# Patient Record
Sex: Male | Born: 1970 | Race: White | Hispanic: No | Marital: Married | Smoking: Never smoker
Health system: Southern US, Community
[De-identification: ages and names within clinical notes are randomized; demographics above are authoritative.]

## PROBLEM LIST (undated history)

## (undated) DIAGNOSIS — E785 Hyperlipidemia, unspecified: Secondary | ICD-10-CM

## (undated) DIAGNOSIS — I10 Essential (primary) hypertension: Secondary | ICD-10-CM

## (undated) DIAGNOSIS — S82142A Displaced bicondylar fracture of left tibia, initial encounter for closed fracture: Secondary | ICD-10-CM

## (undated) DIAGNOSIS — M1990 Unspecified osteoarthritis, unspecified site: Secondary | ICD-10-CM

## (undated) DIAGNOSIS — F419 Anxiety disorder, unspecified: Secondary | ICD-10-CM

## (undated) HISTORY — PX: KNEE ARTHROSCOPY: SHX127

## (undated) HISTORY — PX: TONSILLECTOMY: SUR1361

---

## 1999-02-07 ENCOUNTER — Encounter: Admission: RE | Admit: 1999-02-07 | Discharge: 1999-02-07 | Payer: Self-pay | Admitting: Family Medicine

## 1999-02-07 ENCOUNTER — Encounter: Payer: Self-pay | Admitting: Family Medicine

## 2006-02-12 ENCOUNTER — Ambulatory Visit: Payer: Self-pay | Admitting: Internal Medicine

## 2006-10-02 ENCOUNTER — Ambulatory Visit: Payer: Self-pay | Admitting: Internal Medicine

## 2006-10-11 LAB — CONVERTED CEMR LAB
BUN: 13 mg/dL (ref 6–23)
CO2: 31 meq/L (ref 19–32)
Calcium: 9.2 mg/dL (ref 8.4–10.5)
Chloride: 101 meq/L (ref 96–112)
Creatinine, Ser: 1.2 mg/dL (ref 0.4–1.5)
GFR calc Af Amer: 89 mL/min
GFR calc non Af Amer: 73 mL/min
Glucose, Bld: 66 mg/dL — ABNORMAL LOW (ref 70–99)
Potassium: 4.1 meq/L (ref 3.5–5.1)
Sodium: 139 meq/L (ref 135–145)

## 2006-10-14 ENCOUNTER — Encounter (INDEPENDENT_AMBULATORY_CARE_PROVIDER_SITE_OTHER): Payer: Self-pay | Admitting: *Deleted

## 2007-02-28 ENCOUNTER — Telehealth (INDEPENDENT_AMBULATORY_CARE_PROVIDER_SITE_OTHER): Payer: Self-pay | Admitting: *Deleted

## 2007-03-03 ENCOUNTER — Ambulatory Visit: Payer: Self-pay | Admitting: Family Medicine

## 2007-03-06 ENCOUNTER — Encounter (INDEPENDENT_AMBULATORY_CARE_PROVIDER_SITE_OTHER): Payer: Self-pay | Admitting: *Deleted

## 2007-03-31 ENCOUNTER — Ambulatory Visit: Payer: Self-pay | Admitting: Family Medicine

## 2007-04-01 ENCOUNTER — Telehealth (INDEPENDENT_AMBULATORY_CARE_PROVIDER_SITE_OTHER): Payer: Self-pay | Admitting: Family Medicine

## 2007-04-01 DIAGNOSIS — F411 Generalized anxiety disorder: Secondary | ICD-10-CM

## 2007-04-14 ENCOUNTER — Telehealth (INDEPENDENT_AMBULATORY_CARE_PROVIDER_SITE_OTHER): Payer: Self-pay | Admitting: *Deleted

## 2007-05-27 ENCOUNTER — Telehealth (INDEPENDENT_AMBULATORY_CARE_PROVIDER_SITE_OTHER): Payer: Self-pay | Admitting: *Deleted

## 2007-09-18 ENCOUNTER — Telehealth (INDEPENDENT_AMBULATORY_CARE_PROVIDER_SITE_OTHER): Payer: Self-pay | Admitting: *Deleted

## 2008-01-28 ENCOUNTER — Telehealth (INDEPENDENT_AMBULATORY_CARE_PROVIDER_SITE_OTHER): Payer: Self-pay | Admitting: *Deleted

## 2008-03-11 ENCOUNTER — Ambulatory Visit: Payer: Self-pay | Admitting: Internal Medicine

## 2008-03-11 DIAGNOSIS — M25519 Pain in unspecified shoulder: Secondary | ICD-10-CM | POA: Insufficient documentation

## 2008-03-11 DIAGNOSIS — E785 Hyperlipidemia, unspecified: Secondary | ICD-10-CM | POA: Insufficient documentation

## 2008-03-11 DIAGNOSIS — I10 Essential (primary) hypertension: Secondary | ICD-10-CM

## 2008-03-11 LAB — CONVERTED CEMR LAB
Cholesterol, target level: 200 mg/dL
HDL goal, serum: 40 mg/dL
LDL Goal: 160 mg/dL

## 2008-03-12 ENCOUNTER — Ambulatory Visit: Payer: Self-pay | Admitting: Internal Medicine

## 2008-03-13 LAB — CONVERTED CEMR LAB
BUN: 16 mg/dL (ref 6–23)
CO2: 30 meq/L (ref 19–32)
Calcium: 8.9 mg/dL (ref 8.4–10.5)
Chloride: 102 meq/L (ref 96–112)
Creatinine, Ser: 1.2 mg/dL (ref 0.4–1.5)
GFR calc Af Amer: 88 mL/min
GFR calc non Af Amer: 73 mL/min
Glucose, Bld: 91 mg/dL (ref 70–99)
Potassium: 3.8 meq/L (ref 3.5–5.1)
Sodium: 139 meq/L (ref 135–145)

## 2008-03-15 ENCOUNTER — Encounter (INDEPENDENT_AMBULATORY_CARE_PROVIDER_SITE_OTHER): Payer: Self-pay | Admitting: *Deleted

## 2008-04-26 ENCOUNTER — Ambulatory Visit: Payer: Self-pay | Admitting: Internal Medicine

## 2008-04-26 LAB — CONVERTED CEMR LAB: LDL Goal: 130 mg/dL

## 2008-05-05 ENCOUNTER — Encounter: Payer: Self-pay | Admitting: Internal Medicine

## 2008-07-16 ENCOUNTER — Ambulatory Visit: Payer: Self-pay | Admitting: Family Medicine

## 2008-07-16 DIAGNOSIS — IMO0001 Reserved for inherently not codable concepts without codable children: Secondary | ICD-10-CM

## 2008-07-16 DIAGNOSIS — R5383 Other fatigue: Secondary | ICD-10-CM

## 2008-07-16 DIAGNOSIS — R5381 Other malaise: Secondary | ICD-10-CM | POA: Insufficient documentation

## 2008-07-16 DIAGNOSIS — R079 Chest pain, unspecified: Secondary | ICD-10-CM

## 2008-07-20 ENCOUNTER — Encounter (INDEPENDENT_AMBULATORY_CARE_PROVIDER_SITE_OTHER): Payer: Self-pay | Admitting: *Deleted

## 2008-07-28 ENCOUNTER — Encounter (INDEPENDENT_AMBULATORY_CARE_PROVIDER_SITE_OTHER): Payer: Self-pay | Admitting: *Deleted

## 2009-02-02 ENCOUNTER — Telehealth (INDEPENDENT_AMBULATORY_CARE_PROVIDER_SITE_OTHER): Payer: Self-pay | Admitting: *Deleted

## 2009-05-20 ENCOUNTER — Ambulatory Visit: Payer: Self-pay | Admitting: Internal Medicine

## 2009-08-22 ENCOUNTER — Telehealth (INDEPENDENT_AMBULATORY_CARE_PROVIDER_SITE_OTHER): Payer: Self-pay | Admitting: *Deleted

## 2010-03-26 LAB — CONVERTED CEMR LAB
ALT: 27 units/L (ref 0–53)
AST: 30 units/L (ref 0–37)
Albumin: 4.3 g/dL (ref 3.5–5.2)
Alkaline Phosphatase: 74 units/L (ref 39–117)
BUN: 21 mg/dL (ref 6–23)
Basophils Absolute: 0 10*3/uL (ref 0.0–0.1)
Basophils Absolute: 0.1 10*3/uL (ref 0.0–0.1)
Basophils Relative: 0.4 % (ref 0.0–3.0)
Basophils Relative: 1.6 % — ABNORMAL HIGH (ref 0.0–1.0)
Bilirubin, Direct: 0.1 mg/dL (ref 0.0–0.3)
CO2: 28 meq/L (ref 19–32)
Calcium: 9.1 mg/dL (ref 8.4–10.5)
Chloride: 108 meq/L (ref 96–112)
Cholesterol: 220 mg/dL (ref 0–200)
Creatinine, Ser: 1.3 mg/dL (ref 0.4–1.5)
Direct LDL: 150.4 mg/dL
EBV NA IgG: 3.12 — ABNORMAL HIGH
EBV VCA IgG: 6.09 — ABNORMAL HIGH
EBV VCA IgM: 0.11
Eosinophils Absolute: 0.1 10*3/uL (ref 0.0–0.6)
Eosinophils Absolute: 0.1 10*3/uL (ref 0.0–0.7)
Eosinophils Relative: 0.9 % (ref 0.0–5.0)
Eosinophils Relative: 1.9 % (ref 0.0–5.0)
Folate: 16.3 ng/mL
GFR calc non Af Amer: 66.06 mL/min (ref >60)
Glucose, Bld: 95 mg/dL (ref 70–99)
HCT: 39.4 % (ref 39.0–52.0)
HCT: 41.4 % (ref 39.0–52.0)
HDL: 54.3 mg/dL (ref >39.0)
Hemoglobin: 13.8 g/dL (ref 13.0–17.0)
Hemoglobin: 14.3 g/dL (ref 13.0–17.0)
Lymphocytes Relative: 17.7 % (ref 12.0–46.0)
Lymphocytes Relative: 42.1 % (ref 12.0–46.0)
Lymphs Abs: 1.7 10*3/uL (ref 0.7–4.0)
MCHC: 34.6 g/dL (ref 30.0–36.0)
MCHC: 35.1 g/dL (ref 30.0–36.0)
MCV: 89.5 fL (ref 78.0–100.0)
MCV: 93.3 fL (ref 78.0–100.0)
Mono Screen: NEGATIVE
Monocytes Absolute: 0.2 10*3/uL (ref 0.1–1.0)
Monocytes Absolute: 0.2 10*3/uL (ref 0.2–0.7)
Monocytes Relative: 3.9 % (ref 3.0–11.0)
Monocytes Relative: 4.8 % (ref 3.0–12.0)
Neutro Abs: 2 10*3/uL (ref 1.4–7.7)
Neutro Abs: 4.5 10*3/uL (ref 1.4–7.7)
Neutrophils Relative %: 50.8 % (ref 43.0–77.0)
Neutrophils Relative %: 75.9 % (ref 43.0–77.0)
Platelets: 153 10*3/uL (ref 150.0–400.0)
Platelets: 156 10*3/uL (ref 150–400)
Potassium: 4.5 meq/L (ref 3.5–5.1)
RBC: 4.4 M/uL (ref 4.22–5.81)
RBC: 4.44 M/uL (ref 4.22–5.81)
RDW: 12.2 % (ref 11.5–14.6)
RDW: 12.4 % (ref 11.5–14.6)
Sodium: 140 meq/L (ref 135–145)
TSH: 1.11 microintl units/mL (ref 0.35–5.50)
TSH: 1.33 microintl units/mL (ref 0.35–5.50)
Total Bilirubin: 0.5 mg/dL (ref 0.3–1.2)
Total CHOL/HDL Ratio: 4.1
Total CK: 152 units/L (ref 7–232)
Total Protein: 6.9 g/dL (ref 6.0–8.3)
Triglycerides: 97 mg/dL (ref 0–149)
VLDL: 19 mg/dL (ref 0–40)
Vit D, 25-Hydroxy: 36 ng/mL (ref 30–89)
Vitamin B-12: 445 pg/mL (ref 211–911)
WBC: 4 10*3/uL — ABNORMAL LOW (ref 4.5–10.5)
WBC: 5.9 10*3/uL (ref 4.5–10.5)

## 2010-03-28 NOTE — Progress Notes (Signed)
Summary: refill  Phone Note Refill Request Message from:  Fax from Pharmacy on August 22, 2009 10:07 AM  Refills Requested: Medication #1:  LORAZEPAM 0.5 MG TABS 1 q 8-12 hrs as needed stress. [facility] [address] - fax [phone]   Initial call taken by: Okey Regal [place],  August 22, 2009 10:10 AM    Prescriptions: LORAZEPAM 0.5 MG TABS (LORAZEPAM) 1 q 8-12 hrs as needed stress  #30 x 1   Entered by:   Shonna Chock   Authorized by:   Marga Melnick MD   Signed by:   Shonna Chock on 08/22/2009   Method used:   Printed then faxed to ...       [facility] [address] (retail)       [address]       [place], [place]  [address]       Ph: [phone]       Fax: [phone]   RxID:   [id number]

## 2010-03-28 NOTE — Assessment & Plan Note (Signed)
Summary: bp elevated/kdc   Vital Signs:  Patient profile:   40 year old male Weight:      181.6 pounds BMI:     25.07 Pulse rate:   64 / minute Resp:     14 per minute BP sitting:   116 / 64  (right arm) Cuff size:   regular  Vitals Entered By: Shonna Chock (May 20, 2009 8:03 AM) CC: B/P follow-up and refill meds , Hypertension Management, Lipid Management Comments REVIEWED MED LIST, PATIENT AGREED DOSE AND INSTRUCTION CORRECT    Primary Care Provider:  Alfonse Flavors  CC:  B/P follow-up and refill meds , Hypertension Management, and Lipid Management.  History of Present Illness: He was off meds5 days; BP high 140/88.BP averages 115/70s on meds. On meds 2 years. Major life stressors : building house, selling a home & job issues. No specific diet ; CVE as P-90X with weights & cardio w/o symptoms. Maternal FH of premature CAD/ MI. Statin  D/Ced after 1 month due to fatigue & weakness; he also has been off ASA. NMR reviewed; LDL goal = < 130. Risks discussed.  Hypertension History:      He complains of side effects from treatment, but denies chest pain, palpitations, dyspnea with exertion, orthopnea, PND, peripheral edema, visual symptoms, neurologic problems, and syncope.  Some stress related  headaches .  Further comments include: See comments re: weakness with statin.        Positive major cardiovascular risk factors include hyperlipidemia, hypertension, and family history for ischemic heart disease (females less than 57 years old & males less than 19 years old).  Negative major cardiovascular risk factors include male age less than 80 years old, no history of diabetes, and non-tobacco-user status.        Further assessment for target organ damage reveals no history of ASHD, stroke/TIA, or peripheral vascular disease.    Lipid Management History:      Positive NCEP/ATP III risk factors include family history for ischemic heart disease (females less than 44 years old & males less than 70 years  old) and hypertension.  Negative NCEP/ATP III risk factors include male age less than 16 years old, non-diabetic, non-tobacco-user status, no ASHD (atherosclerotic heart disease), no prior stroke/TIA, no peripheral vascular disease, and no history of aortic aneurysm.      Allergies (verified): No Known Drug Allergies  Past History:  Past Medical History: Anxiety; Hypertension Hyperlipidemia: NMR : LDL 166(1795/422), HDL 53,TG 111. LDL goal = < 130  Review of Systems Eyes:  Denies blurring, double vision, and vision loss-both eyes. CV:  Denies leg cramps with exertion, lightheadness, and near fainting. Neuro:  Denies numbness and tingling. Psych:  Complains of anxiety and panic attacks; denies depression, easily angered, easily tearful, and irritability. Endo:  No constellation of headache, flushing , chest pain & diarrhea.  Physical Exam  General:  well-nourished; alert,appropriate and cooperative throughout examination Eyes:  No corneal or conjunctival inflammation notedNo lid lag.Perrla. Neck:  No deformities, masses, or tenderness noted. Lungs:  Normal respiratory effort, chest expands symmetrically. Lungs are clear to auscultation, no crackles or wheezes. Heart:  Normal rate and regular rhythm. S1 and S2 normal without gallop, murmur, click, rub. Pulses:  R and L carotid,radial,dorsalis pedis and posterior tibial pulses are full and equal bilaterally Extremities:  No clubbing, cyanosis, edema. Neurologic:  alert & oriented X3 and DTRs symmetrical and normal.  No tremor  Skin:  Intact without suspicious lesions or rashes Psych:  memory intact for  recent and remote, normally interactive, good eye contact, not anxious appearing, and not depressed appearing.     Impression & Recommendations:  Problem # 1:  HYPERTENSION (ICD-401.9) controlled back on meds His updated medication list for this problem includes:    Lisinopril 10 Mg Tabs (Lisinopril) .Marland Kitchen... 1 by mouth once  daily  Problem # 2:  HYPERLIPIDEMIA (ICD-272.4) off statin  Problem # 3:  ANXIETY (ICD-300.00)  His updated medication list for this problem includes:    Citalopram Hydrobromide 20 Mg Tabs (Citalopram hydrobromide) .Marland Kitchen... Take one tablet daily    Lorazepam 0.5 Mg Tabs (Lorazepam) .Marland Kitchen... 1 q 8-12 hrs as needed stress  Complete Medication List: 1)  Lisinopril 10 Mg Tabs (Lisinopril) .Marland Kitchen.. 1 by mouth once daily 2)  Citalopram Hydrobromide 20 Mg Tabs (Citalopram hydrobromide) .... Take one tablet daily 3)  Pravastatin Sodium 20 Mg Tabs (pravastatin Sodium)  .Marland Kitchen.. 1 at bedtime 4)  Lorazepam 0.5 Mg Tabs (Lorazepam) .Marland Kitchen.. 1 q 8-12 hrs as needed stress  Hypertension Assessment/Plan:      The patient's hypertensive risk group is category B: At least one risk factor (excluding diabetes) with no target organ damage.  Today's blood pressure is 116/64.    Lipid Assessment/Plan:      Based on NCEP/ATP III, the patient's risk factor category is "2 or more risk factors and a calculated 10 year CAD risk of > 20%".  The patient's lipid goals are as follows: Total cholesterol goal is 200; LDL cholesterol goal is 130; HDL cholesterol goal is 40; Triglyceride goal is 150.  His LDL cholesterol goal has not been met.  Secondary causes for hyperlipidemia have been ruled out.  He has been counseled on adjunctive measures for lowering his cholesterol and has been provided with dietary instructions.    Patient Instructions: 1)  Please schedule a follow-up appointment in 3 months. 2)  Take an Aspirin every day. 3)  BUN,creat,K+ prior to visit, ICD-9:401.9 4)  Hepatic Panel, CPK  prior to visit, ICD-9:995.20 5)  Lipid Panel prior to visit, ICD-9:272.4. Your LDL MINIMAL goal = < 130 based on NMR Lipoprofile Prescriptions: LORAZEPAM 0.5 MG TABS (LORAZEPAM) 1 q 8-12 hrs as needed stress  #30 x 1   Entered and Authorized by:   Marga Melnick MD   Signed by:   Marga Melnick MD on 05/20/2009   Method used:   Print then  Give to Patient   RxID:   [id number] PRAVASTATIN SODIUM 20 MG TABS (PRAVASTATIN SODIUM) 1 at bedtime  #90 x 0   Entered and Authorized by:   Marga Melnick MD   Signed by:   Marga Melnick MD on 05/20/2009   Method used:   Print then Give to Patient   RxID:   [id number] CITALOPRAM HYDROBROMIDE 20 MG TABS (CITALOPRAM HYDROBROMIDE) take one tablet daily  #90 x 0   Entered and Authorized by:   Marga Melnick MD   Signed by:   Marga Melnick MD on 05/20/2009   Method used:   Print then Give to Patient   RxID:   [id number] LISINOPRIL 10 MG TABS (LISINOPRIL) 1 by mouth once daily  #90 x 3   Entered and Authorized by:   Marga Melnick MD   Signed by:   Marga Melnick MD on 05/20/2009   Method used:   Print then Give to Patient   RxID:   [id number]

## 2010-05-23 ENCOUNTER — Other Ambulatory Visit: Payer: Self-pay | Admitting: Internal Medicine

## 2010-05-23 MED ORDER — LISINOPRIL 10 MG PO TABS: 10.0000 mg | ORAL_TABLET | Freq: Every day | ORAL | Status: AC

## 2016-04-04 DIAGNOSIS — I10 Essential (primary) hypertension: Secondary | ICD-10-CM | POA: Diagnosis not present

## 2016-04-04 DIAGNOSIS — E785 Hyperlipidemia, unspecified: Secondary | ICD-10-CM | POA: Diagnosis not present

## 2016-04-04 DIAGNOSIS — N182 Chronic kidney disease, stage 2 (mild): Secondary | ICD-10-CM | POA: Diagnosis not present

## 2016-04-20 DIAGNOSIS — J22 Unspecified acute lower respiratory infection: Secondary | ICD-10-CM | POA: Diagnosis not present

## 2016-04-23 DIAGNOSIS — J069 Acute upper respiratory infection, unspecified: Secondary | ICD-10-CM | POA: Diagnosis not present

## 2016-04-23 DIAGNOSIS — J3489 Other specified disorders of nose and nasal sinuses: Secondary | ICD-10-CM | POA: Diagnosis not present

## 2016-07-16 DIAGNOSIS — Z125 Encounter for screening for malignant neoplasm of prostate: Secondary | ICD-10-CM | POA: Diagnosis not present

## 2016-07-16 DIAGNOSIS — Z1389 Encounter for screening for other disorder: Secondary | ICD-10-CM | POA: Diagnosis not present

## 2016-07-16 DIAGNOSIS — Z0000 Encounter for general adult medical examination without abnormal findings: Secondary | ICD-10-CM | POA: Diagnosis not present

## 2016-08-06 DIAGNOSIS — E785 Hyperlipidemia, unspecified: Secondary | ICD-10-CM | POA: Diagnosis not present

## 2016-08-06 DIAGNOSIS — N182 Chronic kidney disease, stage 2 (mild): Secondary | ICD-10-CM | POA: Diagnosis not present

## 2016-08-06 DIAGNOSIS — I129 Hypertensive chronic kidney disease with stage 1 through stage 4 chronic kidney disease, or unspecified chronic kidney disease: Secondary | ICD-10-CM | POA: Diagnosis not present

## 2017-02-05 DIAGNOSIS — E785 Hyperlipidemia, unspecified: Secondary | ICD-10-CM | POA: Diagnosis not present

## 2017-02-05 DIAGNOSIS — N182 Chronic kidney disease, stage 2 (mild): Secondary | ICD-10-CM | POA: Diagnosis not present

## 2017-02-05 DIAGNOSIS — I129 Hypertensive chronic kidney disease with stage 1 through stage 4 chronic kidney disease, or unspecified chronic kidney disease: Secondary | ICD-10-CM | POA: Diagnosis not present

## 2017-09-30 ENCOUNTER — Inpatient Hospital Stay (HOSPITAL_COMMUNITY)
Admission: AD | Admit: 2017-09-30 | Discharge: 2017-10-03 | DRG: 493 | Disposition: A | Discharge status: Home / Self Care | Payer: BLUE CROSS/BLUE SHIELD | Source: Other Acute Inpatient Hospital | Attending: Orthopedic Surgery | Admitting: Orthopedic Surgery

## 2017-09-30 ENCOUNTER — Encounter (HOSPITAL_COMMUNITY): Payer: Self-pay | Admitting: *Deleted

## 2017-09-30 ENCOUNTER — Other Ambulatory Visit: Payer: Self-pay

## 2017-09-30 DIAGNOSIS — Z01818 Encounter for other preprocedural examination: Secondary | ICD-10-CM

## 2017-09-30 DIAGNOSIS — Z79899 Other long term (current) drug therapy: Secondary | ICD-10-CM

## 2017-09-30 DIAGNOSIS — D62 Acute posthemorrhagic anemia: Secondary | ICD-10-CM | POA: Diagnosis not present

## 2017-09-30 DIAGNOSIS — E8889 Other specified metabolic disorders: Secondary | ICD-10-CM | POA: Diagnosis present

## 2017-09-30 DIAGNOSIS — Y92410 Unspecified street and highway as the place of occurrence of the external cause: Secondary | ICD-10-CM

## 2017-09-30 DIAGNOSIS — F411 Generalized anxiety disorder: Secondary | ICD-10-CM | POA: Diagnosis present

## 2017-09-30 DIAGNOSIS — S82142A Displaced bicondylar fracture of left tibia, initial encounter for closed fracture: Principal | ICD-10-CM | POA: Diagnosis present

## 2017-09-30 DIAGNOSIS — Z419 Encounter for procedure for purposes other than remedying health state, unspecified: Secondary | ICD-10-CM

## 2017-09-30 DIAGNOSIS — F329 Major depressive disorder, single episode, unspecified: Secondary | ICD-10-CM | POA: Diagnosis present

## 2017-09-30 DIAGNOSIS — S40811A Abrasion of right upper arm, initial encounter: Secondary | ICD-10-CM | POA: Diagnosis not present

## 2017-09-30 DIAGNOSIS — I10 Essential (primary) hypertension: Secondary | ICD-10-CM | POA: Diagnosis present

## 2017-09-30 DIAGNOSIS — E785 Hyperlipidemia, unspecified: Secondary | ICD-10-CM | POA: Diagnosis present

## 2017-09-30 DIAGNOSIS — T148XXA Other injury of unspecified body region, initial encounter: Secondary | ICD-10-CM

## 2017-09-30 DIAGNOSIS — S83282A Other tear of lateral meniscus, current injury, left knee, initial encounter: Secondary | ICD-10-CM | POA: Diagnosis present

## 2017-09-30 HISTORY — DX: Displaced bicondylar fracture of left tibia, initial encounter for closed fracture: S82.142A

## 2017-09-30 HISTORY — DX: Hyperlipidemia, unspecified: E78.5

## 2017-09-30 MED ORDER — HYDROMORPHONE HCL 1 MG/ML IJ SOLN
1.0000 mg | INTRAMUSCULAR | Status: DC | PRN
  Administered 2017-10-01 (×4): 1 mg via INTRAVENOUS
  Filled 2017-09-30 (×4): qty 1

## 2017-09-30 MED ORDER — SODIUM CHLORIDE 0.9 % IV SOLN
INTRAVENOUS | Status: DC
Start: 2017-09-30 — End: 2017-10-01
  Administered 2017-09-30: via INTRAVENOUS

## 2017-09-30 MED ORDER — OXYCODONE HCL 5 MG PO TABS
5.0000 mg | ORAL_TABLET | ORAL | Status: DC | PRN
Start: 2017-09-30 — End: 2017-10-01
  Administered 2017-09-30 – 2017-10-01 (×3): 10 mg via ORAL
  Filled 2017-09-30 (×3): qty 2

## 2017-09-30 NOTE — Progress Notes (Signed)
I spoke with Mr Gregory Yoder, patient asked that I speak to his wife Gregory Yoder, because he is in too much pain. Mrs Gregory Yoder answered all of history questions and I instructed her where to come.  Then Mrs Gregory Yoder asked me if we have a bed for patient that [facility].  I said we have a pre op room, then Mrs Gregory Yoder said "he is suppose to transfer today."  I told her that I did not have that information, but would verify with bed placement that he is to be transferred here.  Bed placement confirmed that patient is to be transferred her, but there is no bed assignment at this time.

## 2017-10-01 ENCOUNTER — Inpatient Hospital Stay (HOSPITAL_COMMUNITY): Payer: BLUE CROSS/BLUE SHIELD | Admitting: Certified Registered Nurse Anesthetist

## 2017-10-01 ENCOUNTER — Inpatient Hospital Stay (HOSPITAL_COMMUNITY): Payer: BLUE CROSS/BLUE SHIELD

## 2017-10-01 ENCOUNTER — Inpatient Hospital Stay (HOSPITAL_COMMUNITY)
Admission: RE | Admit: 2017-10-01 | Payer: BLUE CROSS/BLUE SHIELD | Source: Ambulatory Visit | Admitting: Orthopedic Surgery

## 2017-10-01 ENCOUNTER — Encounter (HOSPITAL_COMMUNITY): Payer: Self-pay

## 2017-10-01 ENCOUNTER — Encounter (HOSPITAL_COMMUNITY)
Admission: AD | Disposition: A | Discharge status: Home / Self Care | Payer: Self-pay | Source: Other Acute Inpatient Hospital | Attending: Orthopedic Surgery

## 2017-10-01 DIAGNOSIS — S82142A Displaced bicondylar fracture of left tibia, initial encounter for closed fracture: Secondary | ICD-10-CM

## 2017-10-01 HISTORY — DX: Essential (primary) hypertension: I10

## 2017-10-01 HISTORY — DX: Unspecified osteoarthritis, unspecified site: M19.90

## 2017-10-01 HISTORY — PX: ORIF TIBIA PLATEAU: SHX2132

## 2017-10-01 HISTORY — DX: Displaced bicondylar fracture of left tibia, initial encounter for closed fracture: S82.142A

## 2017-10-01 HISTORY — DX: Anxiety disorder, unspecified: F41.9

## 2017-10-01 LAB — COMPREHENSIVE METABOLIC PANEL
ALT: 20 U/L (ref 0–44)
AST: 23 U/L (ref 15–41)
Albumin: 3.6 g/dL (ref 3.5–5.0)
Alkaline Phosphatase: 66 U/L (ref 38–126)
Anion gap: 9 (ref 5–15)
BUN: 9 mg/dL (ref 6–20)
CHLORIDE: 101 mmol/L (ref 98–111)
CO2: 27 mmol/L (ref 22–32)
Calcium: 8.3 mg/dL — ABNORMAL LOW (ref 8.9–10.3)
Creatinine, Ser: 1.2 mg/dL (ref 0.61–1.24)
GFR calc non Af Amer: >60 mL/min (ref >60)
Glucose, Bld: 100 mg/dL — ABNORMAL HIGH (ref 70–99)
POTASSIUM: 3.6 mmol/L (ref 3.5–5.1)
Sodium: 137 mmol/L (ref 135–145)
TOTAL PROTEIN: 5.9 g/dL — AB (ref 6.5–8.1)
Total Bilirubin: 0.8 mg/dL (ref 0.3–1.2)

## 2017-10-01 LAB — PROTIME-INR
INR: 0.97
Prothrombin Time: 12.8 seconds (ref 11.4–15.2)

## 2017-10-01 LAB — CBC
HCT: 34.8 % — ABNORMAL LOW (ref 39.0–52.0)
Hemoglobin: 11.6 g/dL — ABNORMAL LOW (ref 13.0–17.0)
MCH: 30.4 pg (ref 26.0–34.0)
MCHC: 33.3 g/dL (ref 30.0–36.0)
MCV: 91.1 fL (ref 78.0–100.0)
PLATELETS: 138 10*3/uL — AB (ref 150–400)
RBC: 3.82 MIL/uL — ABNORMAL LOW (ref 4.22–5.81)
RDW: 13 % (ref 11.5–15.5)
WBC: 5.8 10*3/uL (ref 4.0–10.5)

## 2017-10-01 LAB — SEDIMENTATION RATE: Sed Rate: 12 mm/hr (ref 0–16)

## 2017-10-01 LAB — MRSA PCR SCREENING: MRSA by PCR: NEGATIVE

## 2017-10-01 SURGERY — OPEN REDUCTION INTERNAL FIXATION (ORIF) TIBIAL PLATEAU
Anesthesia: General | Laterality: Left

## 2017-10-01 MED ORDER — PROPOFOL 10 MG/ML IV BOLUS
INTRAVENOUS | Status: DC | PRN
  Administered 2017-10-01: 200 mg via INTRAVENOUS

## 2017-10-01 MED ORDER — LIDOCAINE HCL (CARDIAC) PF 100 MG/5ML IV SOSY
PREFILLED_SYRINGE | INTRAVENOUS | Status: DC | PRN
  Administered 2017-10-01: 100 mg via INTRAVENOUS

## 2017-10-01 MED ORDER — MIDAZOLAM HCL 2 MG/2ML IJ SOLN
INTRAMUSCULAR | Status: AC
  Filled 2017-10-01: qty 2

## 2017-10-01 MED ORDER — HYDROMORPHONE HCL 1 MG/ML IJ SOLN: 0.5000 mg | INTRAMUSCULAR | Status: DC | PRN

## 2017-10-01 MED ORDER — POTASSIUM CHLORIDE IN NACL 20-0.9 MEQ/L-% IV SOLN
INTRAVENOUS | Status: DC
  Administered 2017-10-01 – 2017-10-03 (×4): via INTRAVENOUS
  Filled 2017-10-01 (×4): qty 1000

## 2017-10-01 MED ORDER — HYDROMORPHONE HCL 1 MG/ML IJ SOLN
0.5000 mg | INTRAMUSCULAR | Status: DC | PRN
  Administered 2017-10-01 – 2017-10-03 (×9): 1 mg via INTRAVENOUS
  Filled 2017-10-01 (×9): qty 1

## 2017-10-01 MED ORDER — FENTANYL CITRATE (PF) 250 MCG/5ML IJ SOLN
INTRAMUSCULAR | Status: AC
  Filled 2017-10-01: qty 5

## 2017-10-01 MED ORDER — ACETAMINOPHEN 650 MG RE SUPP: 650.0000 mg | Freq: Four times a day (QID) | RECTAL | Status: DC | PRN

## 2017-10-01 MED ORDER — ONDANSETRON HCL 4 MG/2ML IJ SOLN: 4.0000 mg | Freq: Four times a day (QID) | INTRAMUSCULAR | Status: DC | PRN

## 2017-10-01 MED ORDER — LIDOCAINE 2% (20 MG/ML) 5 ML SYRINGE
INTRAMUSCULAR | Status: AC
  Filled 2017-10-01: qty 5

## 2017-10-01 MED ORDER — METHOCARBAMOL 1000 MG/10ML IJ SOLN
1000.0000 mg | Freq: Three times a day (TID) | INTRAVENOUS | Status: DC
  Filled 2017-10-01 (×8): qty 10

## 2017-10-01 MED ORDER — METOCLOPRAMIDE HCL 5 MG PO TABS: 5.0000 mg | ORAL_TABLET | Freq: Three times a day (TID) | ORAL | Status: DC | PRN

## 2017-10-01 MED ORDER — METHOCARBAMOL 750 MG PO TABS
750.0000 mg | ORAL_TABLET | Freq: Three times a day (TID) | ORAL | Status: DC
  Administered 2017-10-01 – 2017-10-03 (×5): 750 mg via ORAL
  Filled 2017-10-01 (×5): qty 1

## 2017-10-01 MED ORDER — ACETAMINOPHEN 325 MG PO TABS: 325.0000 mg | ORAL_TABLET | Freq: Four times a day (QID) | ORAL | Status: DC | PRN

## 2017-10-01 MED ORDER — HYDROCODONE-ACETAMINOPHEN 7.5-325 MG PO TABS: 1.0000 | ORAL_TABLET | ORAL | Status: DC | PRN

## 2017-10-01 MED ORDER — ROCURONIUM BROMIDE 10 MG/ML (PF) SYRINGE
PREFILLED_SYRINGE | INTRAVENOUS | Status: AC
  Filled 2017-10-01: qty 10

## 2017-10-01 MED ORDER — PROPOFOL 10 MG/ML IV BOLUS
INTRAVENOUS | Status: AC
  Filled 2017-10-01: qty 20

## 2017-10-01 MED ORDER — CITALOPRAM HYDROBROMIDE 10 MG PO TABS
10.0000 mg | ORAL_TABLET | Freq: Every day | ORAL | Status: DC
  Administered 2017-10-02 – 2017-10-03 (×2): 10 mg via ORAL
  Filled 2017-10-01 (×2): qty 1

## 2017-10-01 MED ORDER — SUGAMMADEX SODIUM 200 MG/2ML IV SOLN
INTRAVENOUS | Status: DC | PRN
  Administered 2017-10-01: 200 mg via INTRAVENOUS

## 2017-10-01 MED ORDER — FENTANYL CITRATE (PF) 100 MCG/2ML IJ SOLN
INTRAMUSCULAR | Status: DC | PRN
  Administered 2017-10-01: 100 ug via INTRAVENOUS
  Administered 2017-10-01 (×2): 50 ug via INTRAVENOUS
  Administered 2017-10-01: 150 ug via INTRAVENOUS
  Administered 2017-10-01 (×2): 50 ug via INTRAVENOUS

## 2017-10-01 MED ORDER — ENOXAPARIN SODIUM 40 MG/0.4ML ~~LOC~~ SOLN
40.0000 mg | SUBCUTANEOUS | Status: DC
  Administered 2017-10-02 – 2017-10-03 (×2): 40 mg via SUBCUTANEOUS
  Filled 2017-10-01 (×2): qty 0.4

## 2017-10-01 MED ORDER — POLYETHYLENE GLYCOL 3350 17 G PO PACK: 17.0000 g | PACK | Freq: Every day | ORAL | Status: DC

## 2017-10-01 MED ORDER — DOCUSATE SODIUM 100 MG PO CAPS
100.0000 mg | ORAL_CAPSULE | Freq: Two times a day (BID) | ORAL | Status: DC
  Administered 2017-10-02 – 2017-10-03 (×2): 100 mg via ORAL
  Filled 2017-10-01 (×3): qty 1

## 2017-10-01 MED ORDER — FENTANYL CITRATE (PF) 100 MCG/2ML IJ SOLN
25.0000 ug | INTRAMUSCULAR | Status: DC | PRN
  Administered 2017-10-01 (×3): 50 ug via INTRAVENOUS

## 2017-10-01 MED ORDER — HYDROMORPHONE HCL 1 MG/ML IJ SOLN
INTRAMUSCULAR | Status: AC
  Filled 2017-10-01: qty 0.5

## 2017-10-01 MED ORDER — ONDANSETRON HCL 4 MG/2ML IJ SOLN
INTRAMUSCULAR | Status: AC
  Filled 2017-10-01: qty 2

## 2017-10-01 MED ORDER — ACETAMINOPHEN 500 MG PO TABS
500.0000 mg | ORAL_TABLET | Freq: Two times a day (BID) | ORAL | Status: AC
  Administered 2017-10-01 – 2017-10-03 (×4): 500 mg via ORAL
  Filled 2017-10-01 (×4): qty 1

## 2017-10-01 MED ORDER — POLYETHYLENE GLYCOL 3350 17 G PO PACK
17.0000 g | PACK | Freq: Every day | ORAL | Status: DC
  Filled 2017-10-01 (×2): qty 1

## 2017-10-01 MED ORDER — LACTATED RINGERS IV SOLN
INTRAVENOUS | Status: DC
  Administered 2017-10-01 (×3): via INTRAVENOUS

## 2017-10-01 MED ORDER — LACTATED RINGERS IV SOLN
INTRAVENOUS | Status: DC
  Administered 2017-10-01: 10:00:00 via INTRAVENOUS

## 2017-10-01 MED ORDER — CEFAZOLIN SODIUM-DEXTROSE 1-4 GM/50ML-% IV SOLN
1.0000 g | Freq: Four times a day (QID) | INTRAVENOUS | Status: AC
  Administered 2017-10-01 – 2017-10-02 (×3): 1 g via INTRAVENOUS
  Filled 2017-10-01 (×3): qty 50

## 2017-10-01 MED ORDER — PHENYLEPHRINE 40 MCG/ML (10ML) SYRINGE FOR IV PUSH (FOR BLOOD PRESSURE SUPPORT)
PREFILLED_SYRINGE | INTRAVENOUS | Status: AC
  Filled 2017-10-01: qty 20

## 2017-10-01 MED ORDER — FENTANYL CITRATE (PF) 100 MCG/2ML IJ SOLN
INTRAMUSCULAR | Status: AC
  Filled 2017-10-01: qty 2

## 2017-10-01 MED ORDER — ONDANSETRON HCL 4 MG/2ML IJ SOLN
INTRAMUSCULAR | Status: DC | PRN
  Administered 2017-10-01: 4 mg via INTRAVENOUS

## 2017-10-01 MED ORDER — ONDANSETRON HCL 4 MG PO TABS: 4.0000 mg | ORAL_TABLET | Freq: Four times a day (QID) | ORAL | Status: DC | PRN

## 2017-10-01 MED ORDER — CEFAZOLIN SODIUM-DEXTROSE 2-4 GM/100ML-% IV SOLN
INTRAVENOUS | Status: AC
  Filled 2017-10-01: qty 100

## 2017-10-01 MED ORDER — OXYCODONE-ACETAMINOPHEN 7.5-325 MG PO TABS
1.0000 | ORAL_TABLET | Freq: Four times a day (QID) | ORAL | Status: DC | PRN
  Administered 2017-10-01 – 2017-10-02 (×4): 1 via ORAL
  Filled 2017-10-01 (×4): qty 1

## 2017-10-01 MED ORDER — GABAPENTIN 300 MG PO CAPS
300.0000 mg | ORAL_CAPSULE | Freq: Two times a day (BID) | ORAL | Status: DC
Start: 2017-10-01 — End: 2017-10-03
  Administered 2017-10-01 – 2017-10-03 (×4): 300 mg via ORAL
  Filled 2017-10-01 (×4): qty 1

## 2017-10-01 MED ORDER — ROCURONIUM BROMIDE 100 MG/10ML IV SOLN
INTRAVENOUS | Status: DC | PRN
  Administered 2017-10-01: 50 mg via INTRAVENOUS
  Administered 2017-10-01: 30 mg via INTRAVENOUS
  Administered 2017-10-01: 20 mg via INTRAVENOUS

## 2017-10-01 MED ORDER — 0.9 % SODIUM CHLORIDE (POUR BTL) OPTIME
TOPICAL | Status: DC | PRN
  Administered 2017-10-01: 1000 mL

## 2017-10-01 MED ORDER — ACETAMINOPHEN 325 MG PO TABS: 650.0000 mg | ORAL_TABLET | Freq: Four times a day (QID) | ORAL | Status: DC | PRN

## 2017-10-01 MED ORDER — METHOCARBAMOL 1000 MG/10ML IJ SOLN
1000.0000 mg | Freq: Three times a day (TID) | INTRAVENOUS | Status: DC
  Filled 2017-10-01 (×2): qty 10

## 2017-10-01 MED ORDER — METHOCARBAMOL 500 MG PO TABS
1000.0000 mg | ORAL_TABLET | Freq: Three times a day (TID) | ORAL | Status: DC
  Administered 2017-10-01: 1000 mg via ORAL
  Filled 2017-10-01: qty 2

## 2017-10-01 MED ORDER — DOCUSATE SODIUM 100 MG PO CAPS
100.0000 mg | ORAL_CAPSULE | Freq: Two times a day (BID) | ORAL | Status: DC
  Administered 2017-10-01: 100 mg via ORAL
  Filled 2017-10-01: qty 1

## 2017-10-01 MED ORDER — CEFAZOLIN SODIUM-DEXTROSE 2-3 GM-%(50ML) IV SOLR
INTRAVENOUS | Status: DC | PRN
  Administered 2017-10-01: 2 g via INTRAVENOUS

## 2017-10-01 MED ORDER — MIDAZOLAM HCL 5 MG/5ML IJ SOLN
INTRAMUSCULAR | Status: DC | PRN
  Administered 2017-10-01: 2 mg via INTRAVENOUS

## 2017-10-01 MED ORDER — METOCLOPRAMIDE HCL 5 MG/ML IJ SOLN: 5.0000 mg | Freq: Three times a day (TID) | INTRAMUSCULAR | Status: DC | PRN

## 2017-10-01 SURGICAL SUPPLY — 75 items
BANDAGE ACE 4X5 VEL STRL LF (GAUZE/BANDAGES/DRESSINGS) ×3 IMPLANT
BANDAGE ACE 6X5 VEL STRL LF (GAUZE/BANDAGES/DRESSINGS) ×3 IMPLANT
BIT DRILL 100X2.5XANTM LCK (BIT) IMPLANT
BIT DRILL CAL (BIT) IMPLANT
BIT DRL 100X2.5XANTM LCK (BIT) ×1
BNDG COHESIVE 4X5 TAN STRL (GAUZE/BANDAGES/DRESSINGS) ×2 IMPLANT
BNDG GAUZE ELAST 4 BULKY (GAUZE/BANDAGES/DRESSINGS) ×4 IMPLANT
BRUSH SCRUB SURG 4.25 DISP (MISCELLANEOUS) ×6 IMPLANT
COVER SURGICAL LIGHT HANDLE (MISCELLANEOUS) ×6 IMPLANT
DRAPE C-ARM 42X72 X-RAY (DRAPES) ×2 IMPLANT
DRAPE C-ARMOR (DRAPES) ×3 IMPLANT
DRAPE PROXIMA HALF (DRAPES) ×4 IMPLANT
DRAPE U-SHAPE 47X51 STRL (DRAPES) ×3 IMPLANT
DRILL BIT 2.5MM (BIT) ×3
DRILL BIT CAL (BIT) ×3
DRSG ADAPTIC 3X8 NADH LF (GAUZE/BANDAGES/DRESSINGS) ×3 IMPLANT
ELECT REM PT RETURN 9FT ADLT (ELECTROSURGICAL) ×3
ELECTRODE REM PT RTRN 9FT ADLT (ELECTROSURGICAL) ×1 IMPLANT
GAUZE SPONGE 4X4 12PLY STRL (GAUZE/BANDAGES/DRESSINGS) ×3 IMPLANT
GAUZE SPONGE 4X4 12PLY STRL LF (GAUZE/BANDAGES/DRESSINGS) ×2 IMPLANT
GLOVE BIO SURGEON STRL SZ7.5 (GLOVE) ×3 IMPLANT
GLOVE BIO SURGEON STRL SZ8 (GLOVE) ×3 IMPLANT
GLOVE BIOGEL PI IND STRL 7.0 (GLOVE) IMPLANT
GLOVE BIOGEL PI IND STRL 7.5 (GLOVE) ×1 IMPLANT
GLOVE BIOGEL PI IND STRL 8 (GLOVE) ×1 IMPLANT
GLOVE BIOGEL PI INDICATOR 7.0 (GLOVE) ×2
GLOVE BIOGEL PI INDICATOR 7.5 (GLOVE) ×2
GLOVE BIOGEL PI INDICATOR 8 (GLOVE) ×2
GLOVE SURG SS PI 6.5 STRL IVOR (GLOVE) ×2 IMPLANT
GOWN STRL REUS W/ TWL LRG LVL3 (GOWN DISPOSABLE) ×2 IMPLANT
GOWN STRL REUS W/ TWL XL LVL3 (GOWN DISPOSABLE) ×1 IMPLANT
GOWN STRL REUS W/TWL LRG LVL3 (GOWN DISPOSABLE) ×6
GOWN STRL REUS W/TWL XL LVL3 (GOWN DISPOSABLE) ×3
IMMOBILIZER KNEE 22 UNIV (SOFTGOODS) ×2 IMPLANT
K-WIRE ACE 1.6X6 (WIRE) ×3
KIT BASIN OR (CUSTOM PROCEDURE TRAY) ×3 IMPLANT
KIT INFUSE LRG II (Orthopedic Implant) ×2 IMPLANT
KIT TURNOVER KIT B (KITS) ×3 IMPLANT
KWIRE ACE 1.6X6 (WIRE) IMPLANT
NDL SUT 6 .5 CRC .975X.05 MAYO (NEEDLE) IMPLANT
NEEDLE MAYO TAPER (NEEDLE) ×3
NS IRRIG 1000ML POUR BTL (IV SOLUTION) ×3 IMPLANT
PACK ORTHO EXTREMITY (CUSTOM PROCEDURE TRAY) ×3 IMPLANT
PAD ABD 8X10 STRL (GAUZE/BANDAGES/DRESSINGS) ×2 IMPLANT
PAD ARMBOARD 7.5X6 YLW CONV (MISCELLANEOUS) ×6 IMPLANT
PAD CAST 4YDX4 CTTN HI CHSV (CAST SUPPLIES) IMPLANT
PADDING CAST COTTON 4X4 STRL (CAST SUPPLIES) ×3
PADDING CAST COTTON 6X4 STRL (CAST SUPPLIES) ×5 IMPLANT
PLATE LOCK 7H STD LT PROX TIB (Plate) ×2 IMPLANT
SCREW CORTICAL 3.5MM  44MM (Screw) ×2 IMPLANT
SCREW CORTICAL 3.5MM 36MM (Screw) ×2 IMPLANT
SCREW CORTICAL 3.5MM 38MM (Screw) ×2 IMPLANT
SCREW CORTICAL 3.5MM 40MM (Screw) ×4 IMPLANT
SCREW CORTICAL 3.5MM 44MM (Screw) IMPLANT
SCREW LOCK CORT STAR 3.5X60 (Screw) ×2 IMPLANT
SCREW LOCK CORT STAR 3.5X75 (Screw) ×6 IMPLANT
SCREW LOCK CORT STAR 3.5X80 (Screw) ×2 IMPLANT
SCREW LP 3.5X75MM (Screw) ×2 IMPLANT
SCREW LP 3.5X85MM (Screw) ×2 IMPLANT
SPONGE LAP 18X18 X RAY DECT (DISPOSABLE) ×5 IMPLANT
STAPLER VISISTAT 35W (STAPLE) ×2 IMPLANT
STOCKINETTE IMPERVIOUS 9X36 MD (GAUZE/BANDAGES/DRESSINGS) ×2 IMPLANT
SUCTION FRAZIER TIP 10 FR DISP (SUCTIONS) ×2 IMPLANT
SUT PROLENE 0 CT 2 (SUTURE) ×8 IMPLANT
SUT VIC AB 0 CT1 27 (SUTURE) ×3
SUT VIC AB 0 CT1 27XBRD ANBCTR (SUTURE) IMPLANT
SUT VIC AB 2-0 CT1 27 (SUTURE) ×3
SUT VIC AB 2-0 CT1 TAPERPNT 27 (SUTURE) IMPLANT
TOWEL OR 17X24 6PK STRL BLUE (TOWEL DISPOSABLE) ×6 IMPLANT
TOWEL OR 17X26 10 PK STRL BLUE (TOWEL DISPOSABLE) ×6 IMPLANT
TUBE CONNECTING 12'X1/4 (SUCTIONS) ×1
TUBE CONNECTING 12X1/4 (SUCTIONS) ×1 IMPLANT
UNDERPAD 30X30 (UNDERPADS AND DIAPERS) ×3 IMPLANT
WATER STERILE IRR 1000ML POUR (IV SOLUTION) ×2 IMPLANT
YANKAUER SUCT BULB TIP NO VENT (SUCTIONS) ×2 IMPLANT

## 2017-10-01 NOTE — Anesthesia Preprocedure Evaluation (Addendum)
Anesthesia Evaluation  Patient identified by MRN, date of birth, ID band Patient awake    Reviewed: Allergy & Precautions, NPO status , Patient's Chart, lab work & pertinent test results  History of Anesthesia Complications Negative for: history of anesthetic complications  Airway Mallampati: II  TM Distance: >3 FB Neck ROM: Full    Dental  (+) Dental Advisory Given, Teeth Intact   Pulmonary neg pulmonary ROS,    breath sounds clear to auscultation       Cardiovascular Exercise Tolerance: Good hypertension, Pt. on medications  Rhythm:Regular Rate:Normal     Neuro/Psych Anxiety negative neurological ROS     GI/Hepatic negative GI ROS, Neg liver ROS,   Endo/Other  negative endocrine ROS  Renal/GU negative Renal ROS  negative genitourinary   Musculoskeletal  (+) Arthritis ,   Abdominal   Peds  Hematology  (+) anemia ,  Thrombocytopenia    Anesthesia Other Findings   Reproductive/Obstetrics                            Anesthesia Physical Anesthesia Plan  ASA: II  Anesthesia Plan: General   Post-op Pain Management:    Induction:   PONV Risk Score and Plan: 3 and Treatment may vary due to age or medical condition, Ondansetron, Dexamethasone and Midazolam  Airway Management Planned: LMA  Additional Equipment: None  Intra-op Plan:   Post-operative Plan: Extubation in OR  Informed Consent: I have reviewed the patients History and Physical, chart, labs and discussed the procedure including the risks, benefits and alternatives for the proposed anesthesia with the patient or authorized representative who has indicated his/her understanding and acceptance.   Dental advisory given  Plan Discussed with: CRNA and Anesthesiologist  Anesthesia Plan Comments:         Anesthesia Quick Evaluation

## 2017-10-01 NOTE — Plan of Care (Signed)
  Problem: Coping: Goal: Level of anxiety will decrease Outcome: Progressing   Problem: Pain Managment: Goal: General experience of comfort will improve Outcome: Progressing   

## 2017-10-01 NOTE — Brief Op Note (Signed)
09/30/2017 - 10/01/2017  6:16 PM  PATIENT:  Gregory MangesWade T Yoder  47 y.o. male  PRE-OPERATIVE DIAGNOSIS:   1. LEFT BICONDYLAR TIBIAL PLATEAU 2. SUSPECTED LATERAL MENISCUS TEAR  POST-OPERATIVE DIAGNOSIS:   1. LEFT BICONDYLAR TIBIAL PLATEAU 2. COMPLETE LATERAL MENISCUS TEAR CIRCUMFERENTIAL TO POSTERIOR ROOT  PROCEDURE:  Procedure(s): 1. OPEN REDUCTION INTERNAL FIXATION (ORIF) LEFT BICONDYLAR TIBIAL PLATEAU (Left) 2. REPAIR OF LATERAL MENISCUS AVULSION 3. ANTERIOR COMPARTMENT FASCIOTOMY 4. STRESS FLUOROSCOPY  SURGEON:  Surgeon(s) and Role:    Myrene Galas* Peder Allums, MD - Primary  PHYSICIAN ASSISTANT: Montez MoritaKEITH PAUL, PA-C  ANESTHESIA:   general  EBL:  200 mL   BLOOD ADMINISTERED:none  DRAINS: none   LOCAL MEDICATIONS USED:  NONE  SPECIMEN:  No Specimen  DISPOSITION OF SPECIMEN:  N/A  COUNTS:  YES  TOURNIQUET:  * Missing tourniquet times found for documented tourniquets in log: [id number] *  DICTATION: .Other Dictation: Dictation Number written  PLAN OF CARE: Admit to inpatient   PATIENT DISPOSITION:  PACU - hemodynamically stable.   Delay start of Pharmacological VTE agent (>24hrs) due to surgical blood loss or risk of bleeding: no

## 2017-10-01 NOTE — Plan of Care (Signed)
  Problem: Education: Goal: Knowledge of General Education information will improve Description: Including pain rating scale, medication(s)/side effects and non-pharmacologic comfort measures Outcome: Progressing   Problem: Clinical Measurements: Goal: Will remain free from infection Outcome: Progressing   Problem: Activity: Goal: Risk for activity intolerance will decrease Outcome: Progressing   Problem: Nutrition: Goal: Adequate nutrition will be maintained Outcome: Progressing   Problem: Pain Managment: Goal: General experience of comfort will improve Outcome: Progressing   Problem: Safety: Goal: Ability to remain free from injury will improve Outcome: Progressing   
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
## 2017-10-01 NOTE — Transfer of Care (Signed)
Immediate Anesthesia Transfer of Care Note  Patient: Gregory Yoder  Procedure(s) Performed: OPEN REDUCTION INTERNAL FIXATION (ORIF) TIBIAL PLATEAU (Left )  Patient Location: PACU  Anesthesia Type:General  Level of Consciousness: drowsy  Airway & Oxygen Therapy: Patient Spontanous Breathing and Patient connected to nasal cannula oxygen  Post-op Assessment: Report given to RN, Post -op Vital signs reviewed and stable and Patient moving all extremities  Post vital signs: Reviewed and stable  Last Vitals:  Vitals Value Taken Time  BP    Temp    Pulse    Resp    SpO2      Last Pain:  Vitals:   10/01/17 1229  TempSrc:   PainSc: 7          Complications: No apparent anesthesia complications

## 2017-10-01 NOTE — Anesthesia Procedure Notes (Signed)
Procedure Name: Intubation Date/Time: 10/01/2017 3:09 PM Performed by: Audry Pili, MD Pre-anesthesia Checklist: Patient identified, Emergency Drugs available, Suction available, Patient being monitored and Timeout performed Patient Re-evaluated:Patient Re-evaluated prior to induction Oxygen Delivery Method: Circle system utilized Preoxygenation: Pre-oxygenation with 100% oxygen Induction Type: IV induction Ventilation: Mask ventilation without difficulty Laryngoscope Size: Mac and 4 Grade View: Grade I Tube type: Oral Tube size: 7.5 mm Number of attempts: 1 Airway Equipment and Method: Stylet Placement Confirmation: ETT inserted through vocal cords under direct vision,  positive ETCO2 and breath sounds checked- equal and bilateral Secured at: 23 cm Tube secured with: Tape Dental Injury: Teeth and Oropharynx as per pre-operative assessment

## 2017-10-01 NOTE — Anesthesia Postprocedure Evaluation (Signed)
Anesthesia Post Note  Patient: Gregory Yoder  Procedure(s) Performed: OPEN REDUCTION INTERNAL FIXATION (ORIF) TIBIAL PLATEAU (Left )     Patient location during evaluation: PACU Anesthesia Type: General Level of consciousness: awake Pain management: pain level controlled Vital Signs Assessment: post-procedure vital signs reviewed and stable Respiratory status: spontaneous breathing Cardiovascular status: stable Anesthetic complications: no    Last Vitals:  Vitals:   10/01/17 0450  BP: 131/85  Pulse: 69  Resp: 16  Temp: 36.8 C  SpO2: 96%    Last Pain:  Vitals:   10/01/17 1229  TempSrc:   PainSc: 7                  Judythe Postema

## 2017-10-02 ENCOUNTER — Encounter (HOSPITAL_COMMUNITY): Payer: Self-pay | Admitting: Orthopedic Surgery

## 2017-10-02 LAB — CBC
HEMATOCRIT: 32 % — AB (ref 39.0–52.0)
HEMOGLOBIN: 10.7 g/dL — AB (ref 13.0–17.0)
MCH: 30.5 pg (ref 26.0–34.0)
MCHC: 33.4 g/dL (ref 30.0–36.0)
MCV: 91.2 fL (ref 78.0–100.0)
Platelets: 136 10*3/uL — ABNORMAL LOW (ref 150–400)
RBC: 3.51 MIL/uL — ABNORMAL LOW (ref 4.22–5.81)
RDW: 12.6 % (ref 11.5–15.5)
WBC: 5 10*3/uL (ref 4.0–10.5)

## 2017-10-02 LAB — BASIC METABOLIC PANEL
ANION GAP: 9 (ref 5–15)
BUN: 7 mg/dL (ref 6–20)
CHLORIDE: 96 mmol/L — AB (ref 98–111)
CO2: 28 mmol/L (ref 22–32)
Calcium: 7.7 mg/dL — ABNORMAL LOW (ref 8.9–10.3)
Creatinine, Ser: 1.17 mg/dL (ref 0.61–1.24)
GFR calc Af Amer: >60 mL/min (ref >60)
GLUCOSE: 107 mg/dL — AB (ref 70–99)
POTASSIUM: 3.3 mmol/L — AB (ref 3.5–5.1)
Sodium: 133 mmol/L — ABNORMAL LOW (ref 135–145)

## 2017-10-02 LAB — HIV ANTIBODY (ROUTINE TESTING W REFLEX): HIV Screen 4th Generation wRfx: NONREACTIVE

## 2017-10-02 MED ORDER — OXYCODONE-ACETAMINOPHEN 7.5-325 MG PO TABS
1.0000 | ORAL_TABLET | ORAL | Status: DC | PRN
  Administered 2017-10-02 – 2017-10-03 (×8): 1 via ORAL
  Filled 2017-10-02 (×8): qty 1
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
## 2017-10-02 NOTE — Progress Notes (Signed)
[facility] (OTS)  1 Day Post-Op Procedure(s) (LRB): OPEN REDUCTION INTERNAL FIXATION (ORIF) TIBIAL PLATEAU (Left)  Subjective: Patient reports pain as moderate.  No paresthesias.  Objective: Current Vitals Blood pressure (!) 141/90, pulse 82, temperature 98.4 F (36.9 C), temperature source Oral, resp. rate 16, height 6' (1.829 m), weight 93.8 kg (206 lb 12.7 oz), SpO2 96 %. Vital signs in last 24 hours: Temp:  [98.4 F (36.9 C)-99 F (37.2 C)] 98.4 F (36.9 C) (08/07 0442) Pulse Rate:  [82-98] 82 (08/07 0442) Resp:  [14-16] 16 (08/07 0442) BP: (114-154)/(82-99) 141/90 (08/07 0442) SpO2:  [93 %-99 %] 96 % (08/07 0442)  Intake/Output from previous day: 08/06 0701 - 08/07 0700 In: 1800 [P.O.:200; I.V.:1600] Out: 1250 [Urine:1050; Blood:200]  LABS Recent Labs    10/01/17 0908 10/02/17 0453  HGB 11.6* 10.7*   Recent Labs    10/01/17 0908 10/02/17 0453  WBC 5.8 5.0  RBC 3.82* 3.51*  HCT 34.8* 32.0*  PLT 138* 136*   Recent Labs    10/01/17 0908 10/02/17 0453  NA 137 133*  K 3.6 3.3*  CL 101 96*  CO2 27 28  BUN 9 7  CREATININE 1.20 1.17  GLUCOSE 100* 107*  CALCIUM 8.3* 7.7*   Recent Labs    10/01/17 0908  INR 0.97     Physical Exam LLE  Dressing intact, clean, dry  Edema/ swelling controlled  Sens: DPN, SPN, TN intact  Motor: EHL, FHL, and lessor toe ext and flex all intact grossly  Brisk cap refill, warm to touch  Assessment/Plan: 1 Day Post-Op Procedure(s) (LRB): OPEN REDUCTION INTERNAL FIXATION (ORIF) TIBIAL PLATEAU (Left) 1. PT/OT NWB with AROM, AAROM, PROM 2. DVT proph Lovenox 3. D/c tomorrow or Friday depending on PT 4. Drsg change tomorrow  Myrene GalasMichael Dailin Sosnowski, MD [facility] [phone] (p)

## 2017-10-02 NOTE — Progress Notes (Signed)
Spoke with Montez MoritaKeith Paul, PA to clarify hinged knee brace for left leg to be unlocked or locked. Ortho tech needed clarification so that what was ordered was exactly what pt needed.  Hinged brace to be unlocked so pt have can move knee.

## 2017-10-02 NOTE — Progress Notes (Signed)
Orthopedic Tech Progress Note Patient Details:  Gregory Yoder 06/21/1970 [id number]  Ortho Devices Ortho Device/Splint Location: trapeze bar patient helper   Post Interventions Patient Tolerated: Well Instructions Provided: Care of device   [name], Gregory Yoder 10/02/2017, 3:16 PM

## 2017-10-02 NOTE — Progress Notes (Signed)
Orthopedic Tech Progress Note Patient Details:  Gregory Yoder 06/23/1970 [id number]  Patient ID: Gregory Yoder, male   DOB: 08/24/1970, 47 y.o.   MRN: [id number]   [name], [name] 10/02/2017, 2:30 PM Called in hanger brace order; [facility]
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
## 2017-10-03 ENCOUNTER — Encounter (HOSPITAL_COMMUNITY): Payer: Self-pay | Admitting: Orthopedic Surgery

## 2017-10-03 LAB — BASIC METABOLIC PANEL
Anion gap: 7 (ref 5–15)
BUN: 5 mg/dL — ABNORMAL LOW (ref 6–20)
CHLORIDE: 101 mmol/L (ref 98–111)
CO2: 30 mmol/L (ref 22–32)
CREATININE: 1.1 mg/dL (ref 0.61–1.24)
Calcium: 8 mg/dL — ABNORMAL LOW (ref 8.9–10.3)
GFR calc Af Amer: >60 mL/min (ref >60)
GFR calc non Af Amer: >60 mL/min (ref >60)
Glucose, Bld: 114 mg/dL — ABNORMAL HIGH (ref 70–99)
POTASSIUM: 4.2 mmol/L (ref 3.5–5.1)
SODIUM: 138 mmol/L (ref 135–145)

## 2017-10-03 LAB — CBC
HEMATOCRIT: 29.9 % — AB (ref 39.0–52.0)
HEMOGLOBIN: 10.1 g/dL — AB (ref 13.0–17.0)
MCH: 30.3 pg (ref 26.0–34.0)
MCHC: 33.8 g/dL (ref 30.0–36.0)
MCV: 89.8 fL (ref 78.0–100.0)
Platelets: 133 10*3/uL — ABNORMAL LOW (ref 150–400)
RBC: 3.33 MIL/uL — ABNORMAL LOW (ref 4.22–5.81)
RDW: 12.3 % (ref 11.5–15.5)
WBC: 5 10*3/uL (ref 4.0–10.5)

## 2017-10-03 MED ORDER — ACETAMINOPHEN 500 MG PO TABS: 500.0000 mg | ORAL_TABLET | Freq: Two times a day (BID) | ORAL | 1 refills | Status: AC

## 2017-10-03 MED ORDER — ENOXAPARIN (LOVENOX) PATIENT EDUCATION KIT
PACK | Freq: Once | Status: DC
  Filled 2017-10-03: qty 1

## 2017-10-03 MED ORDER — OXYCODONE-ACETAMINOPHEN 7.5-325 MG PO TABS: 1.0000 | ORAL_TABLET | Freq: Four times a day (QID) | ORAL | 0 refills | Status: AC | PRN

## 2017-10-03 MED ORDER — DOCUSATE SODIUM 100 MG PO CAPS: 100.0000 mg | ORAL_CAPSULE | Freq: Two times a day (BID) | ORAL | 0 refills | Status: AC

## 2017-10-03 MED ORDER — METHOCARBAMOL 500 MG PO TABS: 500.0000 mg | ORAL_TABLET | Freq: Three times a day (TID) | ORAL | 1 refills | Status: AC | PRN

## 2017-10-03 MED ORDER — ENOXAPARIN (LOVENOX) PATIENT EDUCATION KIT: 1.0000 | PACK | Freq: Once | 0 refills | Status: AC

## 2017-10-03 MED ORDER — ENOXAPARIN SODIUM 40 MG/0.4ML ~~LOC~~ SOLN: 40.0000 mg | SUBCUTANEOUS | 0 refills | Status: AC
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
## 2017-10-03 NOTE — Progress Notes (Signed)
Pt was up to the hall with a walker. Maint NWB to LLE. Left lower leg dressing change was done by Lanny Hurst PA today. LLE hinged brace on. Discharge instructions was given to pt. Lovenox self injection education and demonstration done, pt verbalized understanding. Lovenox kit was given to the pt. Discharged pt to home accompanied by son and wife.
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
## 2017-10-03 NOTE — Discharge Instructions (Signed)
Orthopaedic Trauma Service Discharge Instructions   General Discharge Instructions  WEIGHT BEARING STATUS: Nonweightbearing left leg  RANGE OF MOTION/ACTIVITY: Unrestricted range of motion left knee and ankle  Home exercise program for L knee ROM- AROM, PROM. Prone exercises as well. No ROM restrictions. Quad sets, SLR, LAQ, SAQ, heel slides, stretching, prone flexion and extension  Ankle theraband program, heel cord stretching, toe towel curls, etc  No pillows under bend of knee when at rest, ok to place under heel to help work on extension.   Wound Care: Daily dressing changes starting on 10/05/2017.  Please see instructions below   Discharge Wound Care Instructions  Do NOT apply any ointments, solutions or lotions to pin sites or surgical wounds.  These prevent needed drainage and even though solutions like hydrogen peroxide kill bacteria, they also damage cells lining the pin sites that help fight infection.  Applying lotions or ointments can keep the wounds moist and can cause them to breakdown and open up as well. This can increase the risk for infection. When in doubt call the office.  Surgical incisions should be dressed daily.  If any drainage is noted, use one layer of adaptic (or other nonstick layer), then gauze, Kerlix, and an ace wrap/TED hose (compression sock).  Once the incision is completely dry and without drainage, it may be left open to air out.  Showering may begin 36-48 hours later.  Cleaning gently with soap and water.  Continue to use compression sock even after you stop using dressings   DVT/PE prophylaxis: Lovenox 40 mg subcutaneous injection daily x 28 days  Diet: as you were eating previously.  Can use over the counter stool softeners and bowel preparations, such as Miralax, to help with bowel movements.  Narcotics can be constipating.  Be sure to drink plenty of fluids  PAIN MEDICATION USE AND EXPECTATIONS  You have likely been given narcotic  medications to help control your pain.  After a traumatic event that results in an fracture (broken bone) with or without surgery, it is ok to use narcotic pain medications to help control one's pain.  We understand that everyone responds to pain differently and each individual patient will be evaluated on a regular basis for the continued need for narcotic medications. Ideally, narcotic medication use should last no more than 6-8 weeks (coinciding with fracture healing).   As a patient it is your responsibility as well to monitor narcotic medication use and report the amount and frequency you use these medications when you come to your office visit.   We would also advise that if you are using narcotic medications, you should take a dose prior to therapy to maximize you participation.  IF YOU ARE ON NARCOTIC MEDICATIONS IT IS NOT PERMISSIBLE TO OPERATE A MOTOR VEHICLE (MOTORCYCLE/CAR/TRUCK/MOPED) OR HEAVY MACHINERY DO NOT MIX NARCOTICS WITH OTHER CNS (CENTRAL NERVOUS SYSTEM) DEPRESSANTS SUCH AS ALCOHOL   STOP SMOKING OR USING NICOTINE PRODUCTS!!!!  As discussed nicotine severely impairs your body's ability to heal surgical and traumatic wounds but also impairs bone healing.  Wounds and bone heal by forming microscopic blood vessels (angiogenesis) and nicotine is a vasoconstrictor (essentially, shrinks blood vessels).  Therefore, if vasoconstriction occurs to these microscopic blood vessels they essentially disappear and are unable to deliver necessary nutrients to the healing tissue.  This is one modifiable factor that you can do to dramatically increase your chances of healing your injury.    (This means no smoking, no nicotine gum, patches, etc)  DO NOT USE NONSTEROIDAL ANTI-INFLAMMATORY DRUGS (NSAID'S)  Using products such as Advil (ibuprofen), Aleve (naproxen), Motrin (ibuprofen) for additional pain control during fracture healing can delay and/or prevent the healing response.  If you would like to  take over the counter (OTC) medication, Tylenol (acetaminophen) is ok.  However, some narcotic medications that are given for pain control contain acetaminophen as well. Therefore, you should not exceed more than 4000 mg of tylenol in a day if you do not have liver disease.  Also note that there are may OTC medicines, such as cold medicines and allergy medicines that my contain tylenol as well.  If you have any questions about medications and/or interactions please ask your doctor/PA or your pharmacist.      ICE AND ELEVATE INJURED/OPERATIVE EXTREMITY  Using ice and elevating the injured extremity above your heart can help with swelling and pain control.  Icing in a pulsatile fashion, such as 20 minutes on and 20 minutes off, can be followed.    Do not place ice directly on skin. Make sure there is a barrier between to skin and the ice pack.    Using frozen items such as frozen peas works well as the conform nicely to the are that needs to be iced.  USE AN ACE WRAP OR TED HOSE FOR SWELLING CONTROL  In addition to icing and elevation, Ace wraps or TED hose are used to help limit and resolve swelling.  It is recommended to use Ace wraps or TED hose until you are informed to stop.    When using Ace Wraps start the wrapping distally (farthest away from the body) and wrap proximally (closer to the body)   Example: If you had surgery on your leg or thing and you do not have a splint on, start the ace wrap at the toes and work your way up to the thigh        If you had surgery on your upper extremity and do not have a splint on, start the ace wrap at your fingers and work your way up to the upper arm  IF YOU ARE IN A SPLINT OR CAST DO NOT REMOVE IT FOR ANY REASON   If your splint gets wet for any reason please contact the office immediately. You may shower in your splint or cast as long as you keep it dry.  This can be done by wrapping in a cast cover or garbage back (or similar)  Do Not stick any thing  down your splint or cast such as pencils, money, or hangers to try and scratch yourself with.  If you feel itchy take benadryl as prescribed on the bottle for itching  IF YOU ARE IN A CAM BOOT (BLACK BOOT)  You may remove boot periodically. Perform daily dressing changes as noted below.  Wash the liner of the boot regularly and wear a sock when wearing the boot. It is recommended that you sleep in the boot until told otherwise  CALL THE OFFICE WITH ANY QUESTIONS OR CONCERNS: [phone]
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
## 2019-01-01 DIAGNOSIS — Z01818 Encounter for other preprocedural examination: Secondary | ICD-10-CM

## 2019-06-21 IMAGING — DX DG KNEE 1-2V PORT*L*
2 series · 2 of 2 positions shown · non-contrast
Comparison: 09/30/2017 radiographs and CT.

CLINICAL DATA: ORIF of left tibial plateau fracture.

EXAM:
PORTABLE LEFT KNEE - 1-2 VIEW

[knee ap]
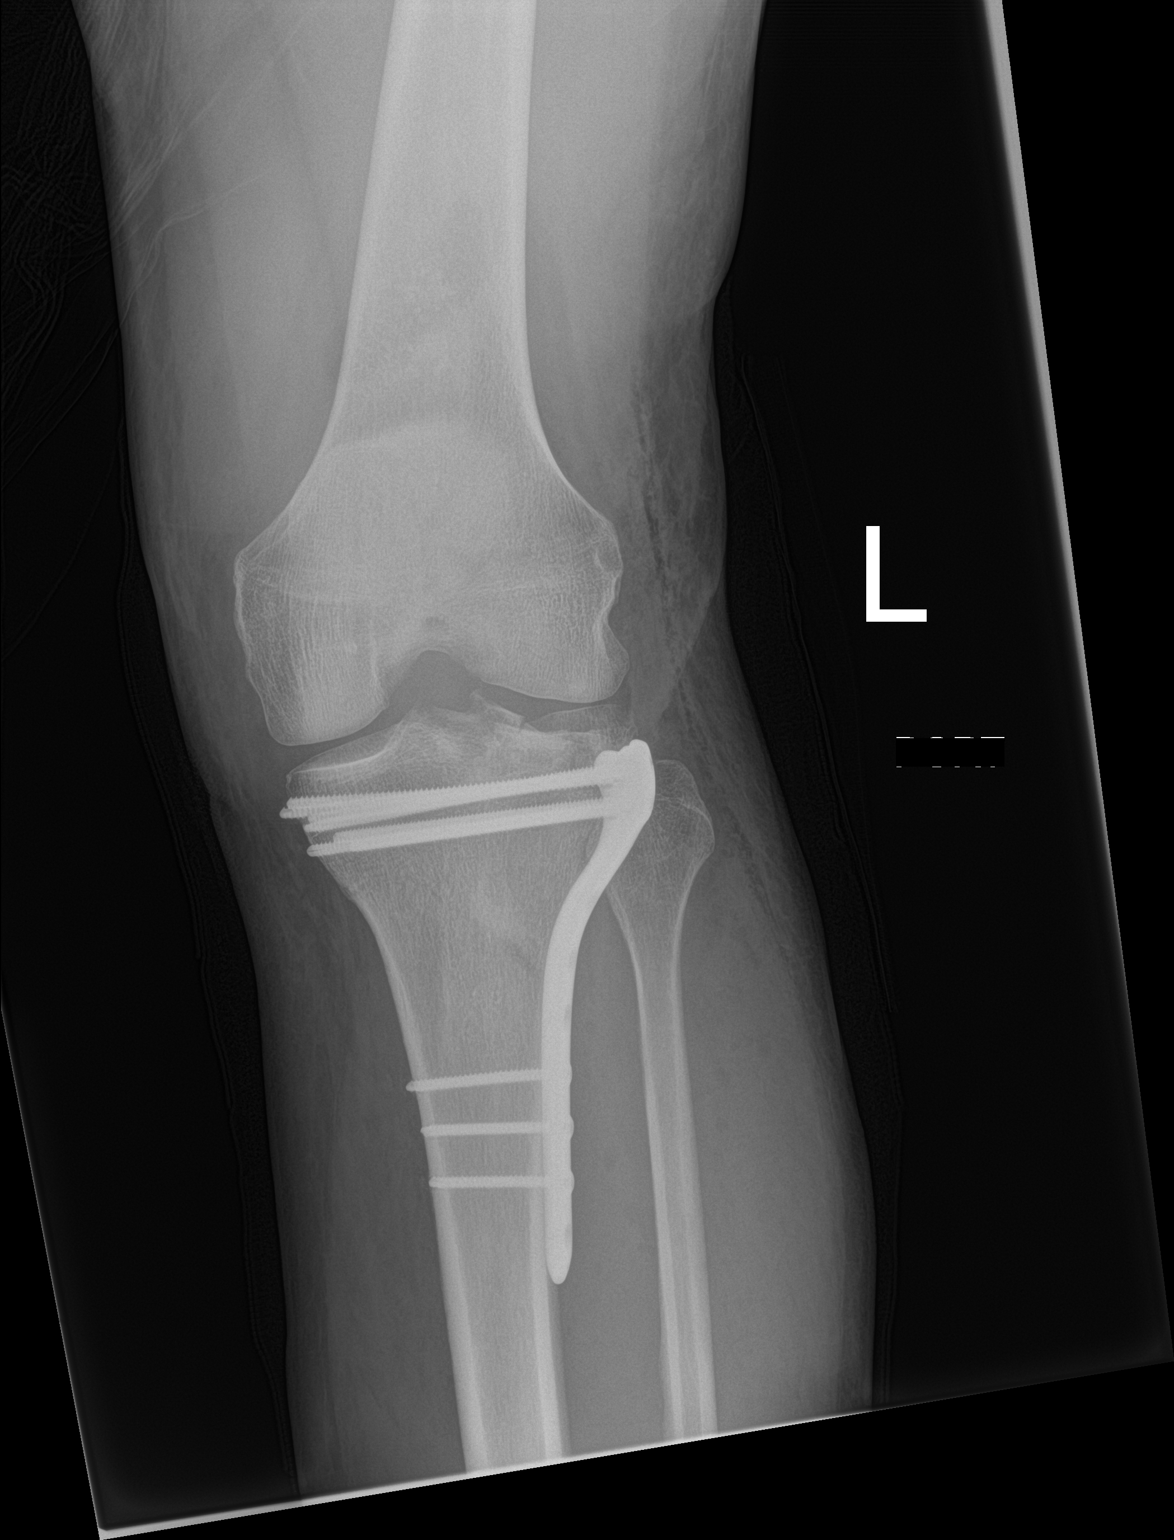

[knee lat]
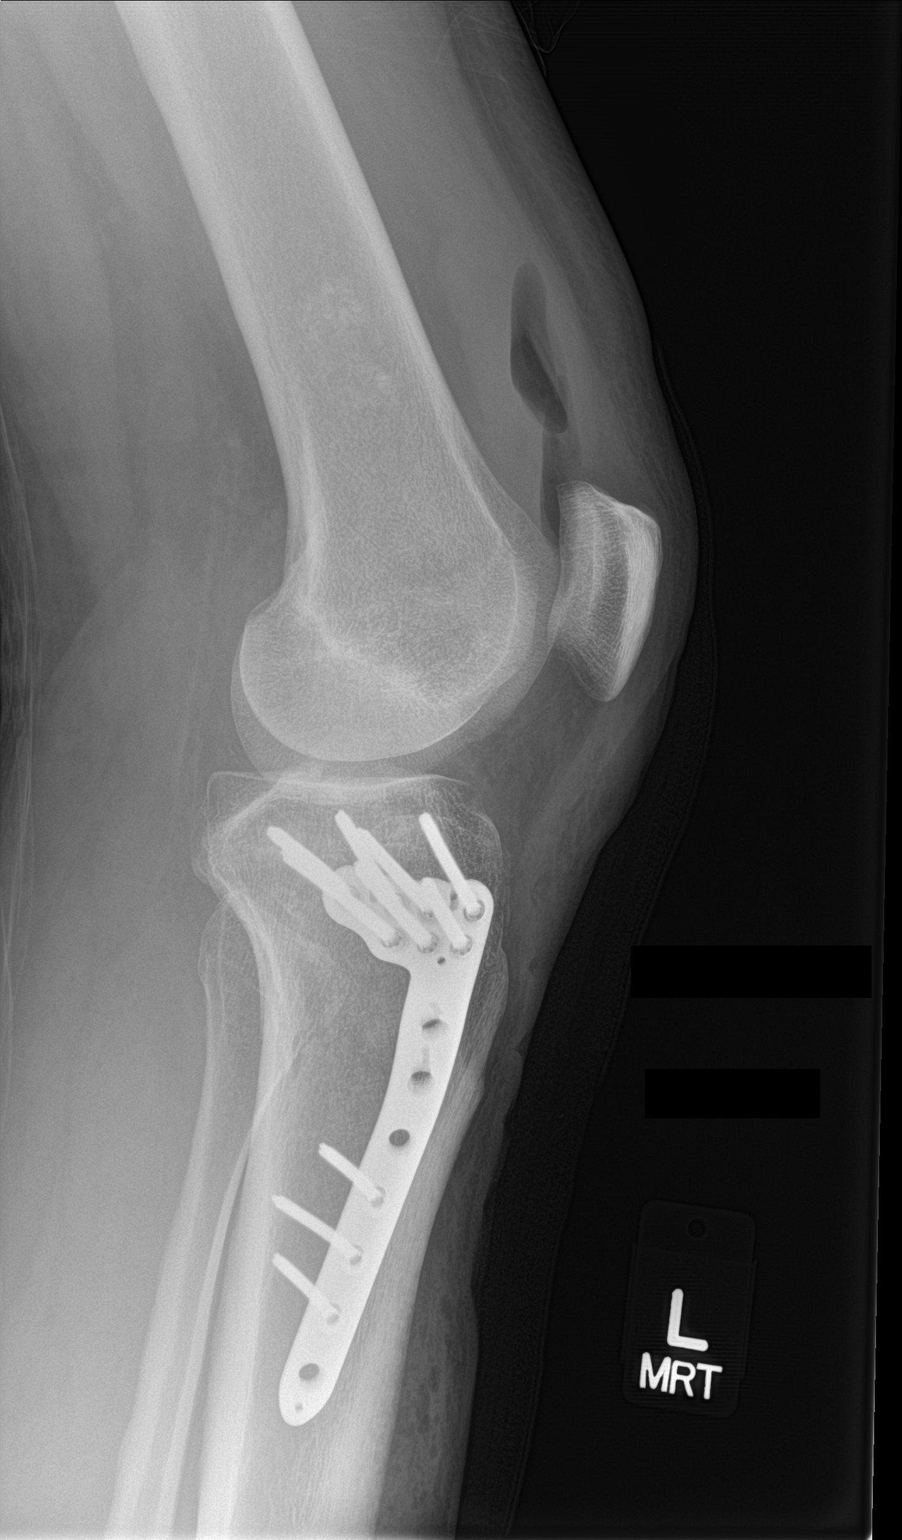

[2 of 2 positions shown; findings below may reference images not displayed]

FINDINGS: New lateral plate and screw fixation with elevation of previously
depressed lateral tibial plateau fracture is not demonstrated.
Intra-articular emphysema is noted in the suprapatellar compartment
with moderate joint effusion and air-fluid level consistent with
recent surgical change. Soft tissue emphysema is noted as well
anteriorly along the proximal leg. No immediate postoperative
complications. Cartilaginous lesion of the distal femur is
redemonstrated.
IMPRESSION: New plate and screw fixation of known split depressed lateral tibial
plateau fracture with elevation of the depressed fragment now noted.
Improved alignment is seen since prior preoperative exam.

## 2019-06-21 IMAGING — RF DG TIBIA/FIBULA 2V*L*
1 series · 6 of 6 positions shown · non-contrast
Comparison: 09/30/2017.

CLINICAL DATA: 47-year-old male with left lateral tibial plateau
fracture. Subsequent encounter.

EXAM:
DG C-ARM 61-120 MIN; LEFT TIBIA AND FIBULA - 2 VIEW
Fluoroscopic time: 45 seconds.

[Series 1: run · 6 of 6 slices shown]
[im 1/6]
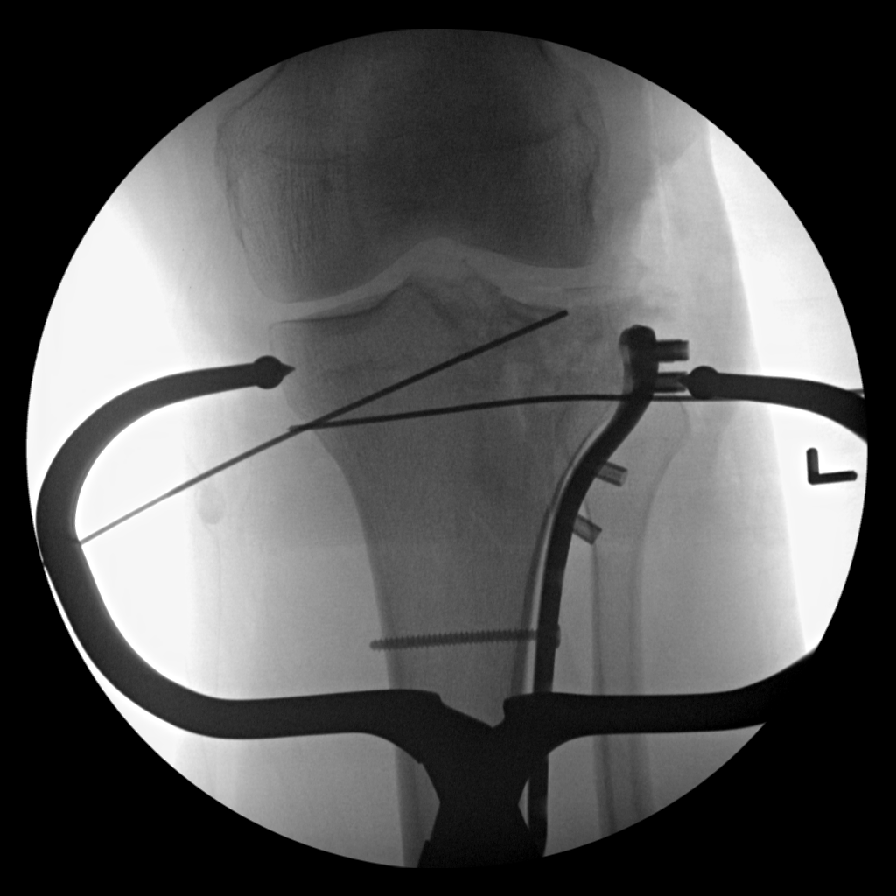
[im 2/6]
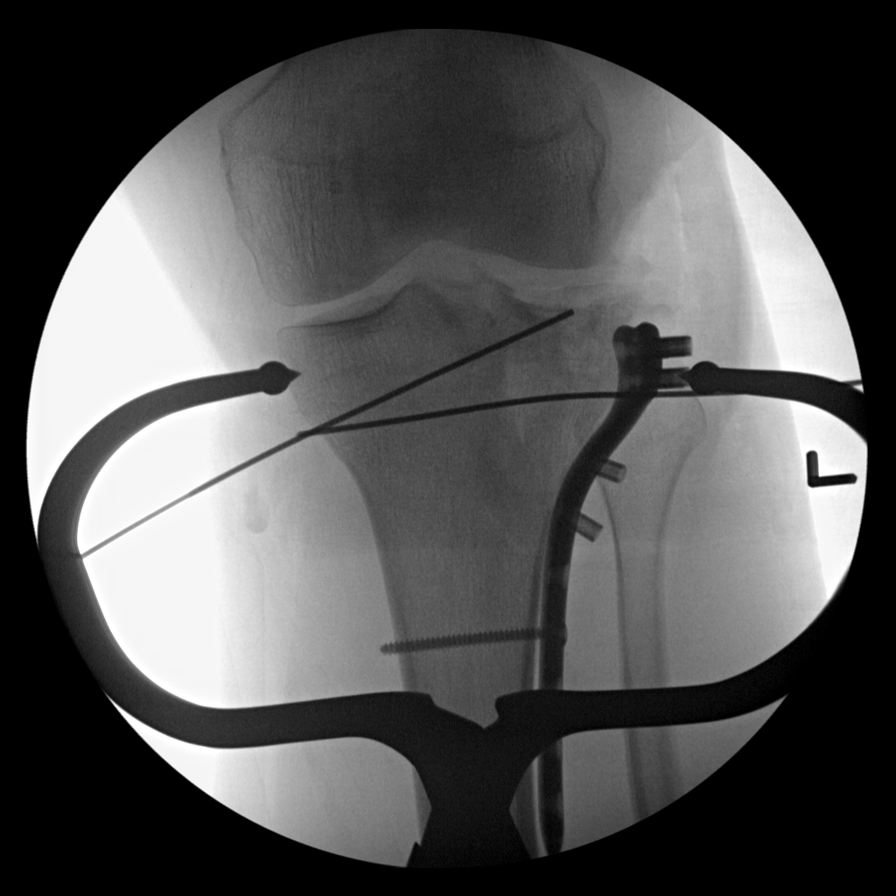
[im 3/6]
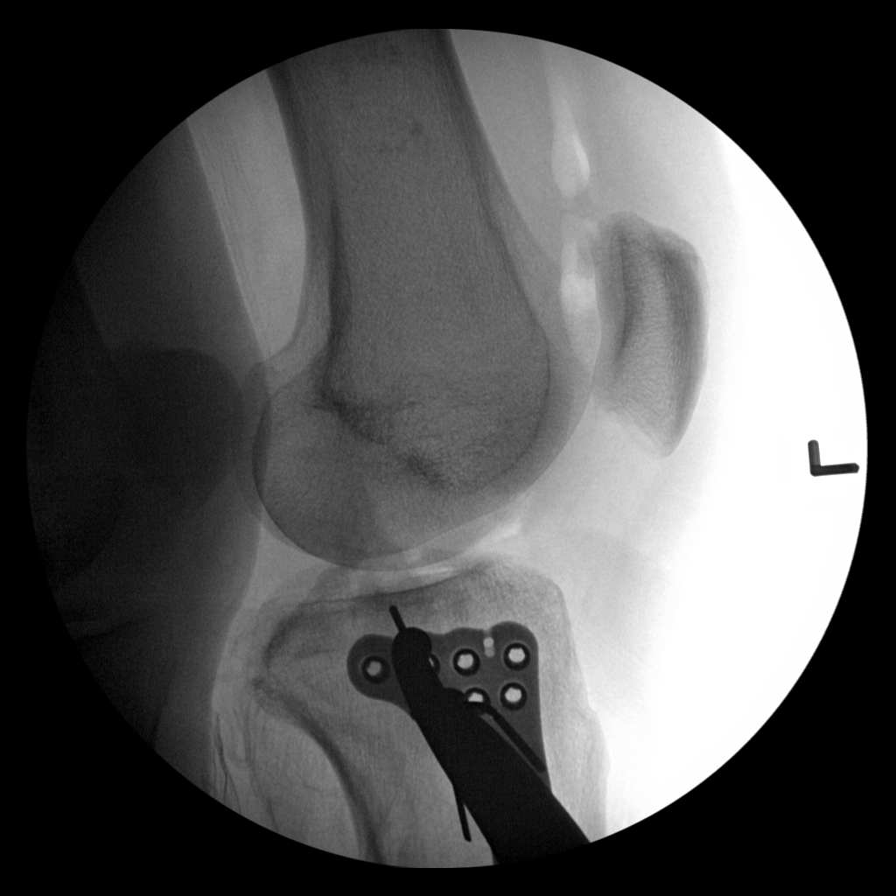
[im 4/6]
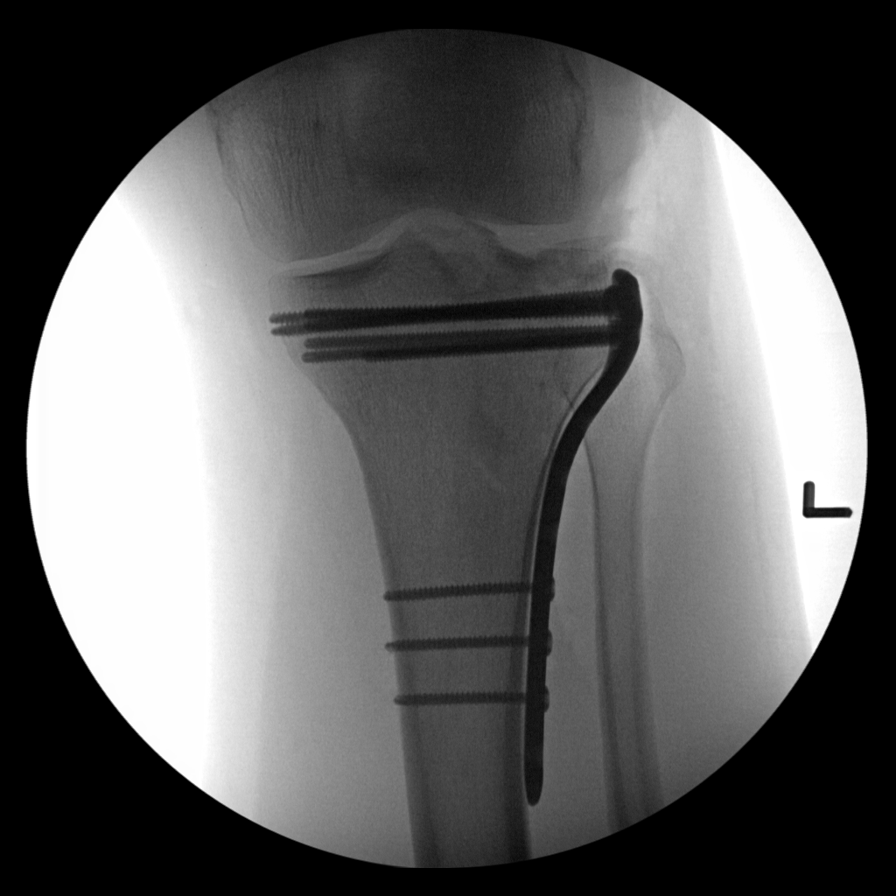
[im 5/6]
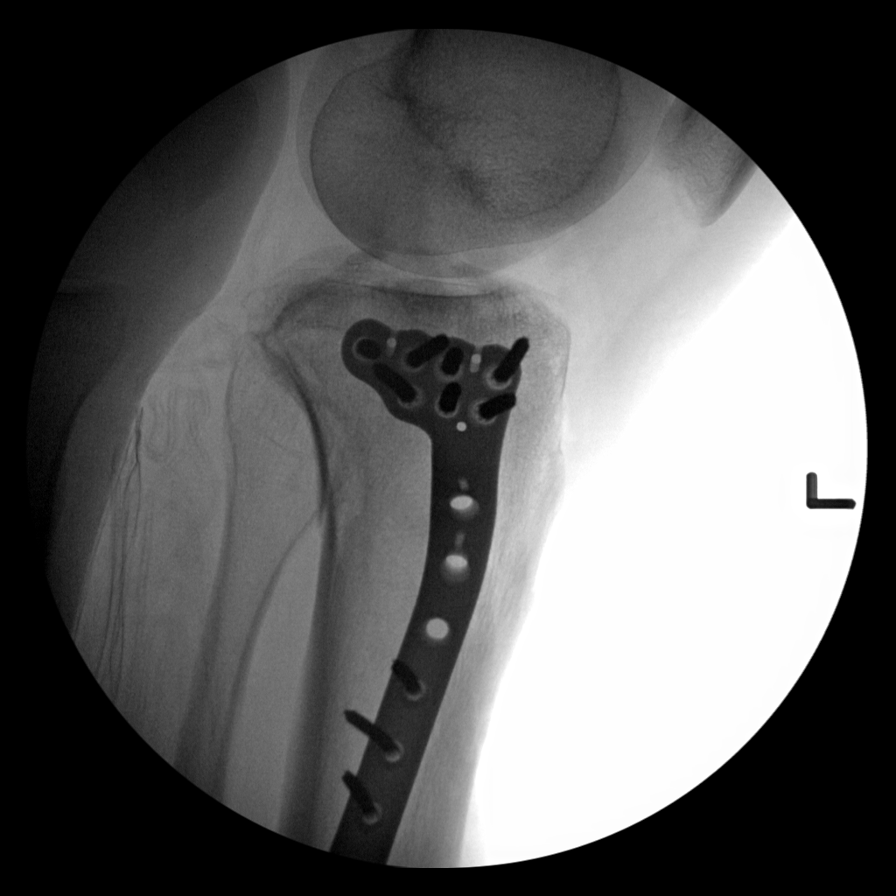
[im 6/6]
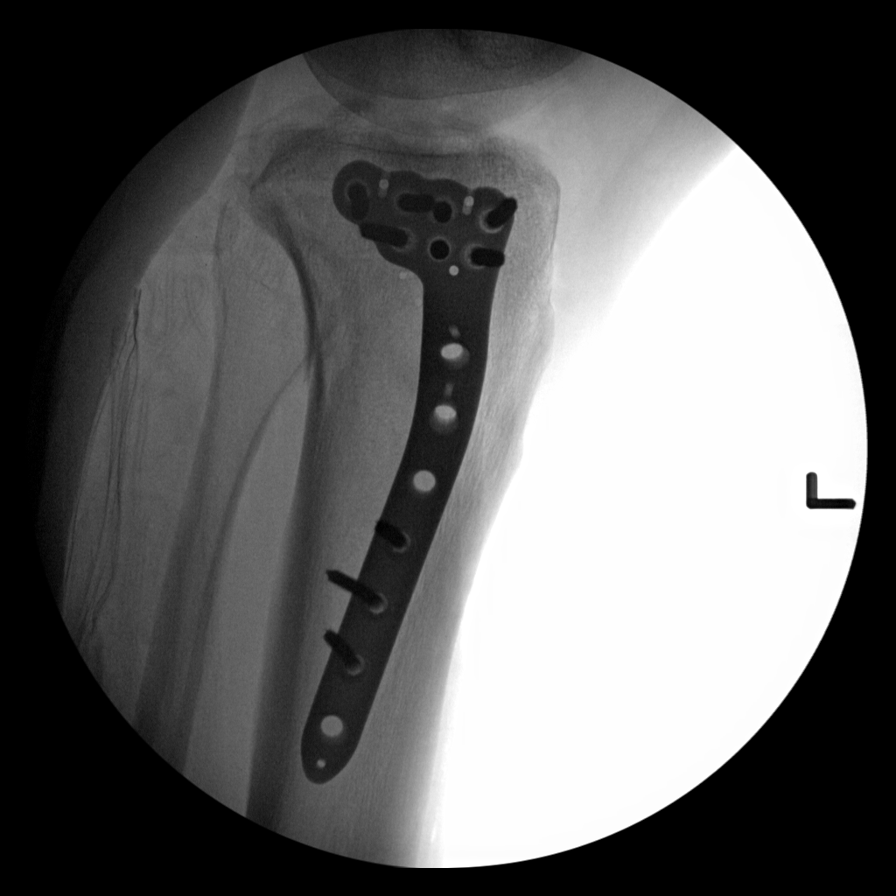

[6 of 6 positions shown; findings below may reference images not displayed]

FINDINGS: Six intraoperative C-arm views submitted for review after surgery.
Sideplate and screws utilized to reduce comminuted left tibial
plateau fracture. Slight incongruity of left tibial plateau
articular surface. This represents a significant improvement from
the preoperative exam.
IMPRESSION: Open reduction and internal fixation of left lateral tibial plateau
fracture.
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
